# Patient Record
Sex: Male | Born: 1970 | Race: Black or African American | Hispanic: No | Smoking: Never smoker
Health system: Southern US, Community
[De-identification: ages and names within clinical notes are randomized; demographics above are authoritative.]

## PROBLEM LIST (undated history)

## (undated) DIAGNOSIS — J45909 Unspecified asthma, uncomplicated: Secondary | ICD-10-CM

## (undated) DIAGNOSIS — I10 Essential (primary) hypertension: Secondary | ICD-10-CM

## (undated) HISTORY — PX: ANTERIOR CRUCIATE LIGAMENT REPAIR: SHX115

---

## 2018-09-12 ENCOUNTER — Emergency Department
Admission: EM | Admit: 2018-09-12 | Discharge: 2018-09-12 | Disposition: A | Discharge status: Home / Self Care | Payer: Managed Care, Other (non HMO) | Attending: Emergency Medicine | Admitting: Emergency Medicine

## 2018-09-12 ENCOUNTER — Other Ambulatory Visit: Payer: Self-pay

## 2018-09-12 ENCOUNTER — Emergency Department: Payer: Managed Care, Other (non HMO)

## 2018-09-12 ENCOUNTER — Encounter: Payer: Self-pay | Admitting: Emergency Medicine

## 2018-09-12 DIAGNOSIS — J4521 Mild intermittent asthma with (acute) exacerbation: Secondary | ICD-10-CM | POA: Diagnosis not present

## 2018-09-12 DIAGNOSIS — R0789 Other chest pain: Secondary | ICD-10-CM | POA: Diagnosis present

## 2018-09-12 HISTORY — DX: Unspecified asthma, uncomplicated: J45.909

## 2018-09-12 MED ORDER — ALBUTEROL SULFATE (2.5 MG/3ML) 0.083% IN NEBU
5.0000 mg | INHALATION_SOLUTION | Freq: Once | RESPIRATORY_TRACT | Status: AC
  Administered 2018-09-12: 5 mg via RESPIRATORY_TRACT
  Filled 2018-09-12: qty 6

## 2018-09-12 MED ORDER — DEXAMETHASONE SODIUM PHOSPHATE 10 MG/ML IJ SOLN
10.0000 mg | Freq: Once | INTRAMUSCULAR | Status: AC
  Administered 2018-09-12: 10 mg via INTRAMUSCULAR
  Filled 2018-09-12: qty 1

## 2018-09-12 MED ORDER — PREDNISONE 10 MG PO TABS: ORAL_TABLET | ORAL | 0 refills | Status: AC

## 2018-09-12 NOTE — Discharge Instructions (Addendum)
Call 1 the clinics listed on your discharge papers for continued care of your asthma.  Continue using your inhalers and begin a steroid taper as you have prednisone at home.  Return to the ED if any severe worsening of your symptoms or urgent concerns.

## 2018-09-12 NOTE — ED Notes (Signed)
Pt presents to ED from home with c/c of chest tightness, pain and SoB. Pt states he is experiencing "tightness and pain" primarily on the right side of his chest. Pain described as a tightness or weight that is making it difficult for him to breathe. Pain radiates down his R arm and he is also experiencing "tingling" in his R fingers. Chest pain is aggravated by deep breaths but is present when at rest. Pt does have Hx of HTN and is currently showing elevated BP. Will perform EKG.

## 2018-09-12 NOTE — ED Triage Notes (Signed)
Says right side feels tight for 2 days. Uses both abuterol inhaler and steroid inhaler brio and SVNs.

## 2018-09-12 NOTE — ED Provider Notes (Signed)
[facility] Emergency Department Provider Note  ____________________________________________   First MD Initiated Contact with Patient 09/12/18 [phone]     (approximate)  I have reviewed the triage vital signs and the nursing notes.   HISTORY  Chief Complaint Asthma   HPI Frank Douglas is a 47 y.o. male presents with complaint of chest tightness for last 2 days.  Patient has a history of asthma for most of his life.  Currently he uses albuterol inhaler and a steroid inhaler (Brio) along with SVNs at home.  Patient had a prednisone taper but did not start it due to the expiration date.  While in triage she was given a DuoNeb treatment and states that he is feeling much better.  He denies any fever, chills, nausea or vomiting.  He denies any chest wall pain or cardiac history.  Initially he rated his pain as a 9/10 but states that he is not having any pain at present and is breathing much better.   Past Medical History:  Diagnosis Date  . Asthma     There are no active problems to display for this patient.   Past Surgical History:  Procedure Laterality Date  . ANTERIOR CRUCIATE LIGAMENT REPAIR      Prior to Admission medications   Medication Sig Start Date End Date Taking? Authorizing Provider  albuterol (PROVENTIL HFA;VENTOLIN HFA) 108 (90 Base) MCG/ACT inhaler Inhale 2 puffs into the lungs every 6 (six) hours as needed for wheezing or shortness of breath.   Yes [provider]  predniSONE (DELTASONE) 10 MG tablet Take 6 tablets  today, on day 2 take 5 tablets, day 3 take 4 tablets, day 4 take 3 tablets, day 5 take  2 tablets and 1 tablet the last day 09/12/18   Tommi Rumps, PA-C    Allergies Fish allergy; Shellfish allergy; Aspirin; and Tomato  No family history on file.  Social History Social History   Tobacco Use  . Smoking status: Never Smoker  . Smokeless tobacco: Never Used  Substance Use Topics  . Alcohol use: Not Currently    . Drug use: Not on file    Review of Systems Constitutional: No fever/chills Eyes: No visual changes. ENT: No sore throat.  Negative for nasal congestion. Cardiovascular: Denies chest pain. Respiratory: Denies shortness of breath.  Negative for cough.  Positive for asthma. Gastrointestinal: No abdominal pain.  No nausea, no vomiting.   Musculoskeletal: Negative for muscle aches. Skin: Negative for rash. Neurological: Negative for headaches, focal weakness or numbness. ___________________________________________   PHYSICAL EXAM:  VITAL SIGNS: ED Triage Vitals  Enc Vitals Group     BP 09/12/18 0747 (!) 140/108     Pulse Rate 09/12/18 0747 (!) 118     Resp 09/12/18 0747 16     Temp 09/12/18 0747 98.2 F (36.8 C)     Temp Source 09/12/18 0747 Oral     SpO2 09/12/18 0747 97 %     Weight 09/12/18 0744 220 lb (99.8 kg)     Height 09/12/18 0744 5\' 11"  (1.803 m)     Head Circumference --      Peak Flow --      Pain Score 09/12/18 0743 9     Pain Loc --      Pain Edu? --      Excl. in GC? --    Constitutional: Alert and oriented. Well appearing and in no acute distress. Eyes: Conjunctivae are normal.  Head: Atraumatic. Nose: No  congestion/rhinnorhea. Mouth/Throat: Mucous membranes are moist.  Oropharynx non-erythematous. Neck: No stridor.   Cardiovascular: Normal rate, regular rhythm. Grossly normal heart sounds.  Good peripheral circulation. Respiratory: Normal respiratory effort.  No retractions. Lungs CTAB.  No wheezes or respiratory difficulty.  No accessory muscles.  Patient is able talking complete sentences without any difficulties. Musculoskeletal: Moves upper and lower extremities with any difficulty and normal gait was noted. Neurologic:  Normal speech and language. No gross focal neurologic deficits are appreciated. No gait instability. Skin:  Skin is warm, dry and intact. No rash noted. Psychiatric: Mood and affect are normal. Speech and behavior are  normal.  ____________________________________________   LABS (all labs ordered are listed, but only abnormal results are displayed)  Labs Reviewed - No data to display ____________________________________________  EKG EKG was read by Dr. Shaune PollackLord. Sinus tachycardia with a ventricular rate of 104.  PR interval is 128, QRS 78  ____________________________________________  RADIOLOGY  ED MD interpretation:  Chest x-ray negative for infiltrate.  Official radiology report(s): Dg Chest 2 View  Result Date: 09/12/2018 CLINICAL DATA:  Shortness of breath and chest pain beginning this morning. EXAM: CHEST - 2 VIEW COMPARISON:  None. FINDINGS: The lungs are clear. Heart size is normal. No pneumothorax or pleural fluid. No acute or focal bony abnormality. IMPRESSION: Negative chest. Electronically Signed   By: Drusilla Kannerhomas  Dalessio M.D.   On: 09/12/2018 08:24   ____________________________________________   PROCEDURES  Procedure(s) performed: None  Procedures  Critical Care performed: No  ____________________________________________   INITIAL IMPRESSION / ASSESSMENT AND PLAN / ED COURSE  As part of my medical decision making, I reviewed the following data within the electronic MEDICAL RECORD NUMBER Notes from prior ED visits and [place] Controlled Substance Database  Patient presents to the ED with complaint of flareup of his asthma.  Patient has a history of asthma for approximately 40 some years.  He has used his inhalers at home without complete relief.  Patient had an expired pack of prednisone but did not initiate it.  Patient states he is feeling much better since he had a DuoNeb treatment while in triage.  Patient chest x-ray was negative and patient is moving air without any difficulties.  He was given a prescription for prednisone 6-day taper.  He will continue with his inhalers and SVNs at home.  He is encouraged to get a primary care provider in this area.  He is to return to the ED if any  severe worsening of his symptoms.  ____________________________________________   FINAL CLINICAL IMPRESSION(S) / ED DIAGNOSES  Final diagnoses:  Mild intermittent asthma with exacerbation     ED Discharge Orders         Ordered    predniSONE (DELTASONE) 10 MG tablet     09/12/18 1010           Note:  This document was prepared using Dragon voice recognition software and may include unintentional dictation errors.    Tommi RumpsSummers, Stephen Turnbaugh L, PA-C 09/12/18 1032    Emily FilbertWilliams, Jonathan E, MD 09/12/18 1034

## 2019-11-23 IMAGING — CR DG CHEST 2V
1 series · 2 of 2 positions shown · non-contrast
Comparison: None.

CLINICAL DATA: Shortness of breath and chest pain beginning this
morning.

EXAM:
CHEST - 2 VIEW

[Series 1: dg chest 2 view · 0.14mm/px · 2 of 2 slices shown]
[im 1/2]
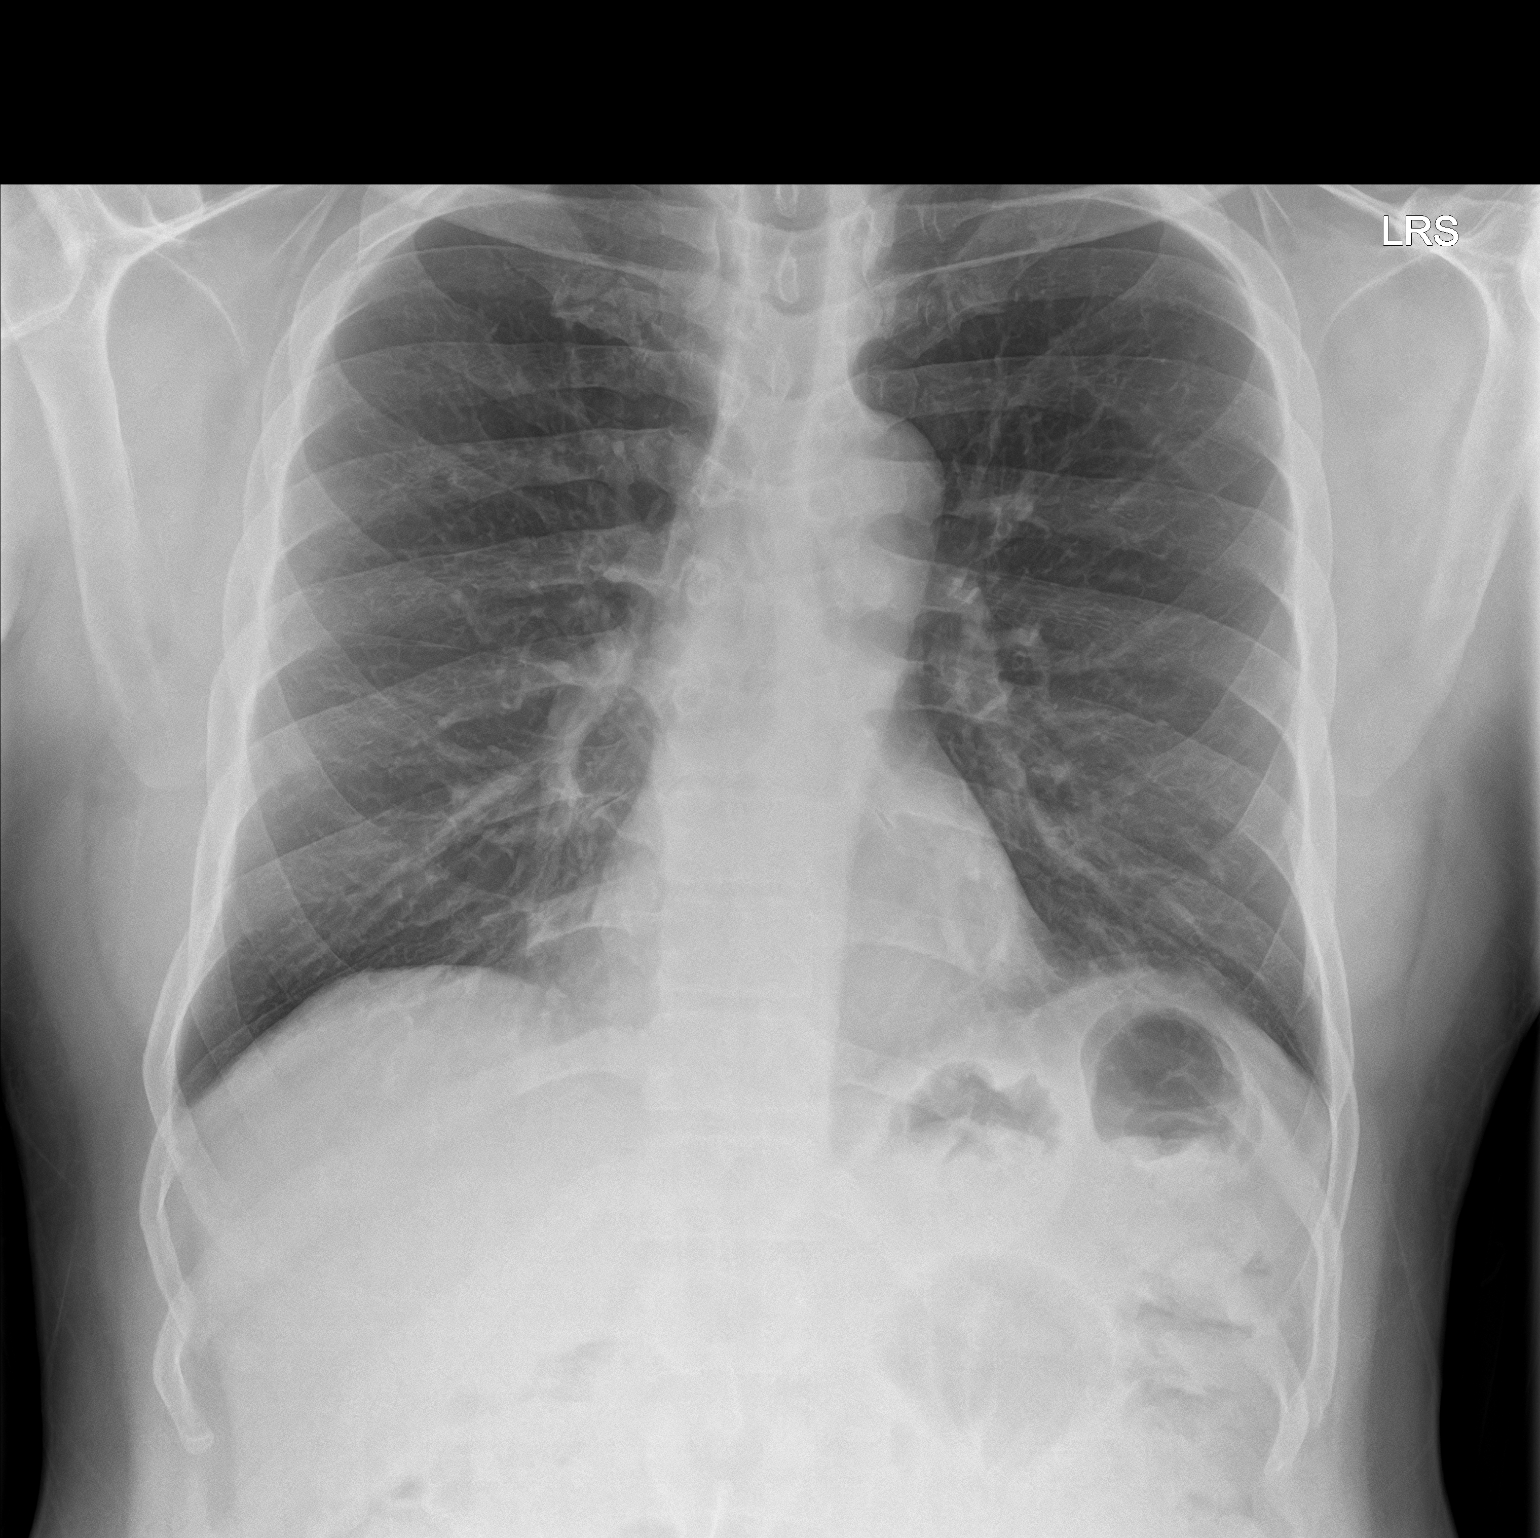
[im 2/2]
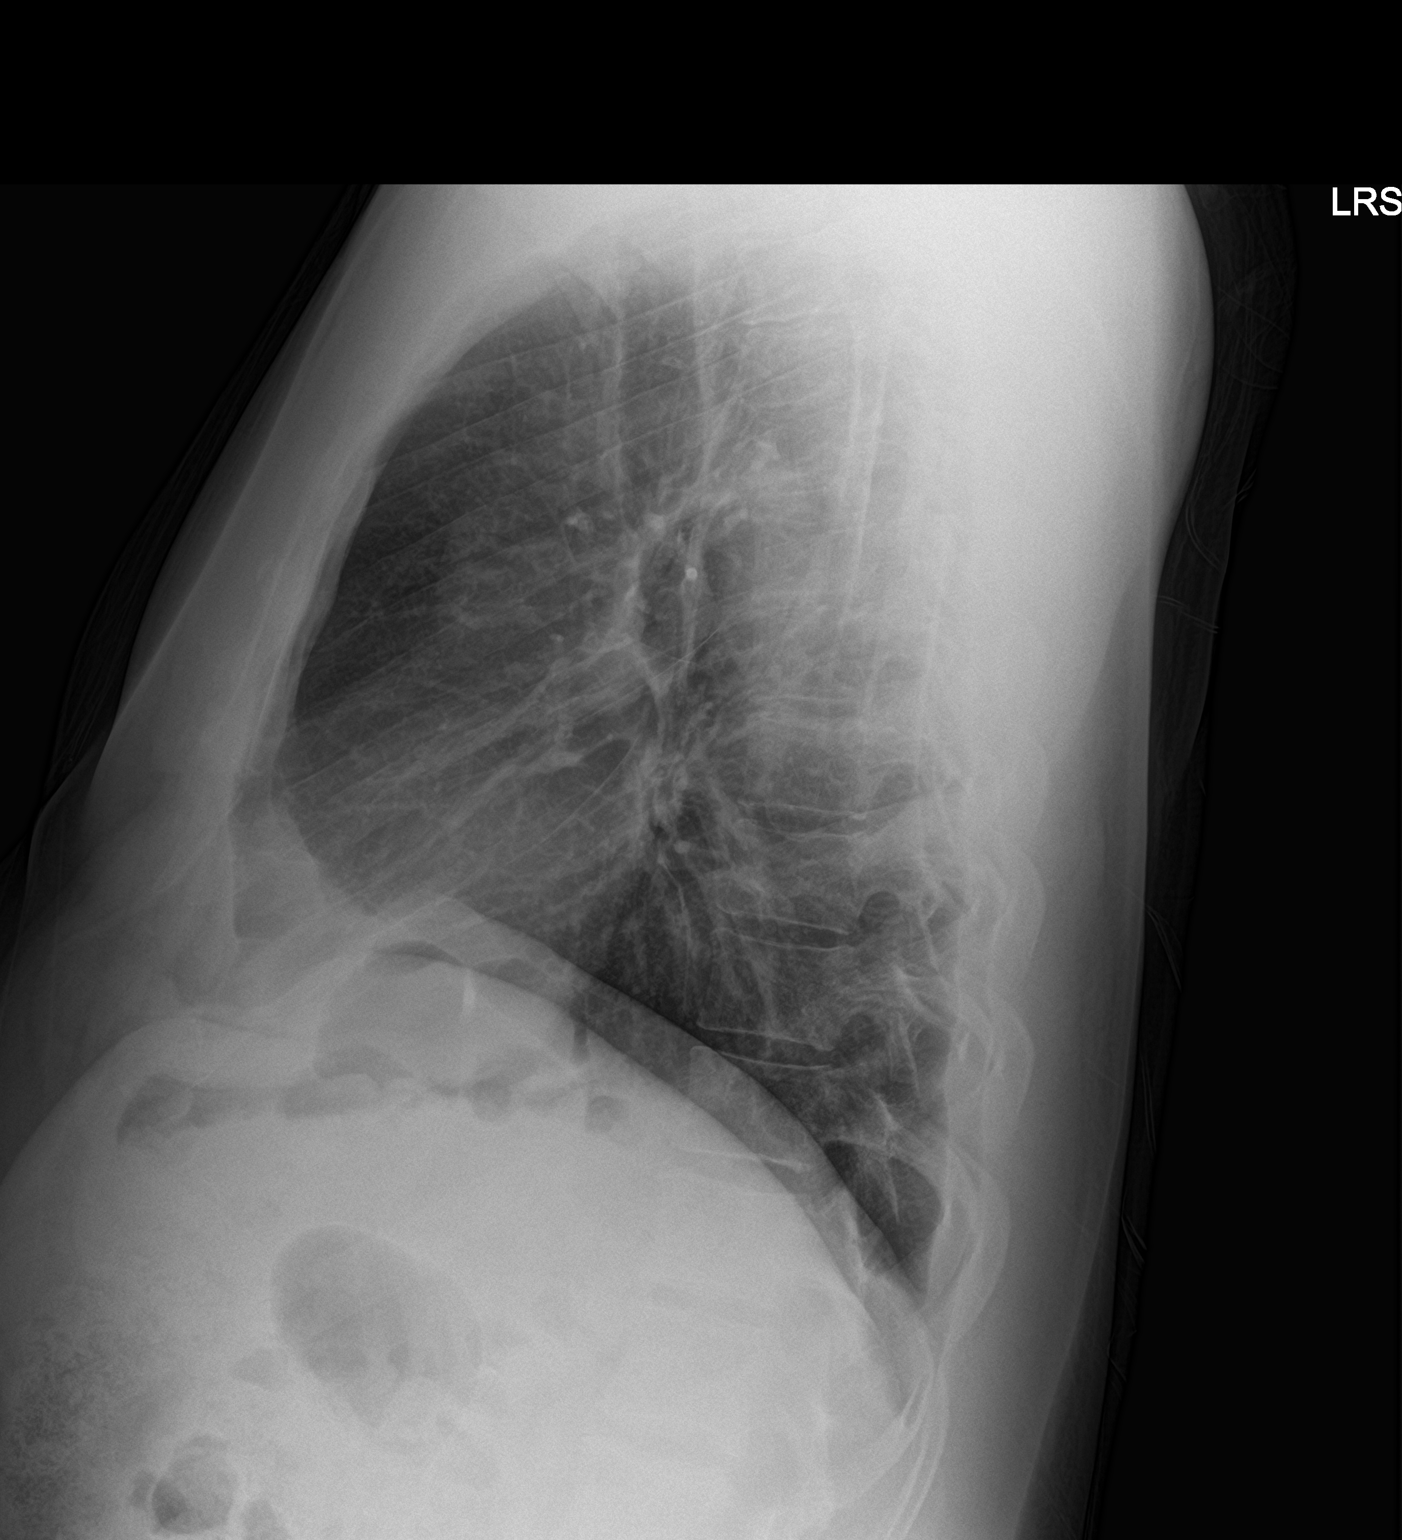

[2 of 2 positions shown; findings below may reference images not displayed]

FINDINGS: The lungs are clear. Heart size is normal. No pneumothorax or
pleural fluid. No acute or focal bony abnormality.
IMPRESSION: Negative chest.

## 2019-12-31 ENCOUNTER — Ambulatory Visit: Payer: Managed Care, Other (non HMO) | Attending: Internal Medicine

## 2019-12-31 DIAGNOSIS — Z23 Encounter for immunization: Secondary | ICD-10-CM

## 2019-12-31 NOTE — Progress Notes (Signed)
   Covid-19 Vaccination Clinic  Name:  Leighton Brickley    MRN: [id number] DOB: 10-29-1970  12/31/2019  Mr. Aird was observed post Covid-19 immunization for 30 minutes based on pre-vaccination screening without incident. He was provided with Vaccine Information Sheet and instruction to access the V-Safe system.   Mr. Mcelhannon was instructed to call 911 with any severe reactions post vaccine: Marland Kitchen Difficulty breathing  . Swelling of face and throat  . A fast heartbeat  . A bad rash all over body  . Dizziness and weakness   Immunizations Administered    Name Date Dose VIS Date Route   Pfizer COVID-19 Vaccine 12/31/2019  3:39 PM 0.3 mL 09/14/2019 Intramuscular   Manufacturer: [employer], [employer]   Lot: [id number]   NDC: [address]-6

## 2020-01-23 ENCOUNTER — Ambulatory Visit: Payer: Managed Care, Other (non HMO) | Attending: Internal Medicine

## 2020-01-23 DIAGNOSIS — Z23 Encounter for immunization: Secondary | ICD-10-CM

## 2020-01-23 NOTE — Progress Notes (Signed)
   Covid-19 Vaccination Clinic  Name:  Frank Douglas    MRN: [id number] DOB: 1970/12/08  01/23/2020  [name] was observed post Covid-19 immunization for 30 minutes based on pre-vaccination screening without incident. He was provided with Vaccine Information Sheet and instruction to access the V-Safe system.   [name] was instructed to call 911 with any severe reactions post vaccine: Marland Kitchen Difficulty breathing  . Swelling of face and throat  . A fast heartbeat  . A bad rash all over body  . Dizziness and weakness   Immunizations Administered    Name Date Dose VIS Date Route   Pfizer COVID-19 Vaccine 01/23/2020 10:29 AM 0.3 mL 11/28/2018 Intramuscular   Manufacturer: [employer], [employer]   Lot: [id number]   NDC: [address]-0

## 2020-09-19 ENCOUNTER — Ambulatory Visit: Payer: Managed Care, Other (non HMO) | Attending: Internal Medicine

## 2020-09-19 DIAGNOSIS — Z23 Encounter for immunization: Secondary | ICD-10-CM

## 2020-09-19 NOTE — Progress Notes (Signed)
° °  Covid-19 Vaccination Clinic  Name:  Austan Nicholl    MRN: [id number] DOB: 10-31-1970  09/19/2020  Mr. Matlack was observed post Covid-19 immunization for 30 minutes based on pre-vaccination screening without incident. He was provided with Vaccine Information Sheet and instruction to access the V-Safe system.   Mr. Anspach was instructed to call 911 with any severe reactions post vaccine:  Difficulty breathing   Swelling of face and throat   A fast heartbeat   A bad rash all over body   Dizziness and weakness   Immunizations Administered    Name Date Dose VIS Date Route   Pfizer COVID-19 Vaccine 09/19/2020  3:36 PM 0.3 mL 07/23/2020 Intramuscular   Manufacturer: [employer], [employer]   Lot: [id number]   NDC: [address]-5

## 2021-06-18 ENCOUNTER — Emergency Department
Admission: EM | Admit: 2021-06-18 | Discharge: 2021-06-18 | Disposition: A | Discharge status: Home / Self Care | Payer: Managed Care, Other (non HMO) | Attending: Emergency Medicine | Admitting: Emergency Medicine

## 2021-06-18 ENCOUNTER — Other Ambulatory Visit: Payer: Self-pay

## 2021-06-18 ENCOUNTER — Encounter: Payer: Self-pay | Admitting: Intensive Care

## 2021-06-18 DIAGNOSIS — I10 Essential (primary) hypertension: Secondary | ICD-10-CM | POA: Insufficient documentation

## 2021-06-18 DIAGNOSIS — J45909 Unspecified asthma, uncomplicated: Secondary | ICD-10-CM | POA: Diagnosis not present

## 2021-06-18 DIAGNOSIS — H9202 Otalgia, left ear: Secondary | ICD-10-CM | POA: Diagnosis present

## 2021-06-18 DIAGNOSIS — H6982 Other specified disorders of Eustachian tube, left ear: Secondary | ICD-10-CM | POA: Insufficient documentation

## 2021-06-18 DIAGNOSIS — Z7951 Long term (current) use of inhaled steroids: Secondary | ICD-10-CM | POA: Insufficient documentation

## 2021-06-18 HISTORY — DX: Essential (primary) hypertension: I10

## 2021-06-18 MED ORDER — AMOXICILLIN 500 MG PO TABS: 500.0000 mg | ORAL_TABLET | Freq: Three times a day (TID) | ORAL | 0 refills | Status: AC

## 2021-06-18 MED ORDER — FLUTICASONE PROPIONATE 50 MCG/ACT NA SUSP: 2.0000 | Freq: Every day | NASAL | 0 refills | Status: AC

## 2021-06-18 MED ORDER — CETIRIZINE HCL 10 MG PO TABS: 10.0000 mg | ORAL_TABLET | Freq: Every day | ORAL | 0 refills | Status: AC

## 2021-06-18 NOTE — ED Provider Notes (Signed)
[facility] Emergency Department Provider Note ____________________________________________  Time seen: Approximately 6:10 PM  I have reviewed the triage vital signs and the nursing notes.   HISTORY  Chief Complaint Otalgia (left)    HPI Frank Douglas is a 50 y.o. male who presents to the emergency department for treatment and evaluation of left earache. He states that he sneezed multiple times in a row and afterward had severe ear pain. No relief with Tylenol and hot shower.    Past Medical History:  Diagnosis Date   Asthma    Hypertension     There are no problems to display for this patient.   Past Surgical History:  Procedure Laterality Date   ANTERIOR CRUCIATE LIGAMENT REPAIR      Prior to Admission medications   Medication Sig Start Date End Date Taking? Authorizing Provider  amoxicillin (AMOXIL) 500 MG tablet Take 1 tablet (500 mg total) by mouth 3 (three) times daily. 06/18/21  Yes Deone Omahoney B, FNP  cetirizine (ZYRTEC ALLERGY) 10 MG tablet Take 1 tablet (10 mg total) by mouth daily. 06/18/21 06/18/22 Yes Somaya Grassi B, FNP  fluticasone (FLONASE) 50 MCG/ACT nasal spray Place 2 sprays into both nostrils daily. 06/18/21  Yes Regan Llorente B, FNP  albuterol (PROVENTIL HFA;VENTOLIN HFA) 108 (90 Base) MCG/ACT inhaler Inhale 2 puffs into the lungs every 6 (six) hours as needed for wheezing or shortness of breath.    [provider]  predniSONE (DELTASONE) 10 MG tablet Take 6 tablets  today, on day 2 take 5 tablets, day 3 take 4 tablets, day 4 take 3 tablets, day 5 take  2 tablets and 1 tablet the last day 09/12/18   Tommi Rumps, PA-C    Allergies Aspirin, Fish allergy, Shellfish allergy, Chocolate, Lactose intolerance (gi), Motrin [ibuprofen], and Tomato  History reviewed. No pertinent family history.  Social History Social History   Tobacco Use   Smoking status: Never   Smokeless tobacco: Never  Substance Use Topics    Alcohol use: Yes    Comment: occ   Drug use: Never    Review of Systems Constitutional: Negative for fever. Positive for decreased ability to hear from left ear(s). Eyes: Negative for discharge or drainage. ENT:       Positive for otalgia in left ear(s).      Negative for rhinorrhea or congestion.      Negative for sore throat. Gastrointestinal: Negative for nausea, vomiting, or diarrhea. Musculoskeletal: Negative for myalgias. Skin: Negative for rash, lesions, or wounds. Neurological: Negative for paresthesias. ____________________________________________   PHYSICAL EXAM:  VITAL SIGNS: ED Triage Vitals  Enc Vitals Group     BP 06/18/21 1724 (!) 129/100     Pulse Rate 06/18/21 1724 83     Resp 06/18/21 1724 17     Temp 06/18/21 1724 98.2 F (36.8 C)     Temp Source 06/18/21 1724 Oral     SpO2 06/18/21 1724 99 %     Weight 06/18/21 1725 209 lb (94.8 kg)     Height 06/18/21 1725 5\' 11"  (1.803 m)     Head Circumference --      Peak Flow --      Pain Score 06/18/21 1734 5     Pain Loc --      Pain Edu? --      Excl. in GC? --     Constitutional: Overall well appearing. Eyes: Conjunctivae are clear without discharge or drainage. Ears:  Right TM: normal.      Left TM: Erythematous, dull, bulging, EAC is patent.. Head: Atraumatic. Nose: No rhinorrhea or sinus pain on percussion. Mouth/Throat: Oropharynx normal. Tonsils normal without exudate. Hematological/Lymphatic/Immunilogical: No palpable anterior cervical lymphadenopathy. Cardiovascular: Heart rate and rhythm are regular without murmur, gallop, or rub appreciated. Respiratory: Breath sounds are clear throughout to auscultation.  Neurologic:  Alert and oriented x 4. Skin: Intact and without rash, lesion, or wound on exposed skin surfaces. ____________________________________________   LABS (all labs ordered are listed, but only abnormal results are displayed)  Labs Reviewed - No data to  display ____________________________________________   RADIOLOGY  Not indicated ____________________________________________   PROCEDURES  Procedure(s) performed:   Procedures  ____________________________________________   INITIAL IMPRESSION / ASSESSMENT AND PLAN / ED COURSE  50 year old male presenting to the emergency department for treatment and evaluation of left earache.  See HPI for further details.  Exam shows that he does have a pretty significant eustachian tube dysfunction.  He will be treated with Flonase, cetirizine, and amoxicillin.  He is to follow-up with primary care or return to the emergency department for symptoms that are not improving, change, or worsen.  Pertinent labs & imaging results that were available during my care of the patient were reviewed by me and considered in my medical decision making (see chart for details). ____________________________________________   FINAL CLINICAL IMPRESSION(S) / ED DIAGNOSES  Final diagnoses:  Eustachian tube dysfunction, left    ED Discharge Orders          Ordered    amoxicillin (AMOXIL) 500 MG tablet  3 times daily        06/18/21 1857    fluticasone (FLONASE) 50 MCG/ACT nasal spray  Daily        06/18/21 1857    cetirizine (ZYRTEC ALLERGY) 10 MG tablet  Daily        06/18/21 1857            If controlled substance prescribed during this visit, 12 month history viewed on the [facility] prior to issuing an initial prescription for Schedule II or III opiod.   Note:  This document was prepared using Dragon voice recognition software and may include unintentional dictation errors.     Chinita Pester, FNP 06/18/21 1905    Merwyn Katos, MD 06/18/21 1911

## 2021-06-18 NOTE — Discharge Instructions (Signed)
Please take the medication as prescribed.  Follow up with primary care for symptoms that are not improving over the next few days.  Return to the ER for symptoms that change or worsen if unable to schedule an appointment.

## 2021-06-18 NOTE — ED Triage Notes (Signed)
Patient c/o left ear pain that started today with nausea.
